# Patient Record
Sex: Male | Born: 1985 | Race: Black or African American | Hispanic: No | Marital: Single | Smoking: Current every day smoker
Health system: Southern US, Community
[De-identification: ages and names within clinical notes are randomized; demographics above are authoritative.]

---

## 2010-03-17 ENCOUNTER — Emergency Department: Payer: Self-pay | Admitting: Emergency Medicine

## 2010-11-16 ENCOUNTER — Emergency Department: Payer: Self-pay | Admitting: Emergency Medicine

## 2011-09-05 ENCOUNTER — Emergency Department: Payer: Self-pay | Admitting: Emergency Medicine

## 2011-12-07 ENCOUNTER — Emergency Department: Payer: Self-pay | Admitting: Emergency Medicine

## 2011-12-07 LAB — URINALYSIS, COMPLETE
Blood: NEGATIVE
Ketone: NEGATIVE
Nitrite: NEGATIVE
Ph: 6 (ref 4.5–8.0)
Protein: NEGATIVE
Specific Gravity: 1.018 (ref 1.003–1.030)

## 2012-02-21 ENCOUNTER — Emergency Department: Payer: Self-pay | Admitting: Emergency Medicine

## 2012-02-21 LAB — URINALYSIS, COMPLETE
Bilirubin,UR: NEGATIVE
Glucose,UR: NEGATIVE mg/dL (ref 0–75)
Ketone: NEGATIVE
Leukocyte Esterase: NEGATIVE
Nitrite: NEGATIVE
Specific Gravity: 1.005 (ref 1.003–1.030)
WBC UR: 2 /HPF (ref 0–5)

## 2012-12-30 ENCOUNTER — Emergency Department: Payer: Self-pay | Admitting: Internal Medicine

## 2012-12-30 LAB — URINALYSIS, COMPLETE
Bacteria: NONE SEEN
Bilirubin,UR: NEGATIVE
Blood: NEGATIVE
Ketone: NEGATIVE
Nitrite: NEGATIVE
Protein: NEGATIVE
RBC,UR: 3 /HPF (ref 0–5)
Squamous Epithelial: NONE SEEN

## 2013-01-03 ENCOUNTER — Emergency Department: Payer: Self-pay | Admitting: Emergency Medicine

## 2013-01-03 LAB — URINALYSIS, COMPLETE
Bilirubin,UR: NEGATIVE
Leukocyte Esterase: NEGATIVE
Nitrite: NEGATIVE
Protein: 30
Specific Gravity: 1.03 (ref 1.003–1.030)

## 2014-01-25 ENCOUNTER — Emergency Department: Payer: Self-pay | Admitting: Emergency Medicine

## 2014-01-26 LAB — CBC
HCT: 43.4 % (ref 40.0–52.0)
HGB: 13.8 g/dL (ref 13.0–18.0)
MCH: 30 pg (ref 26.0–34.0)
MCHC: 31.8 g/dL — ABNORMAL LOW (ref 32.0–36.0)
MCV: 94 fL (ref 80–100)
PLATELETS: 334 10*3/uL (ref 150–440)
RBC: 4.61 10*6/uL (ref 4.40–5.90)
RDW: 13.2 % (ref 11.5–14.5)
WBC: 10.1 10*3/uL (ref 3.8–10.6)

## 2014-01-26 LAB — COMPREHENSIVE METABOLIC PANEL
ALBUMIN: 3.7 g/dL (ref 3.4–5.0)
ALK PHOS: 88 U/L
ALT: 35 U/L
Anion Gap: 17 — ABNORMAL HIGH (ref 7–16)
BUN: 9 mg/dL (ref 7–18)
Bilirubin,Total: 0.3 mg/dL (ref 0.2–1.0)
CO2: 20 mmol/L — AB (ref 21–32)
Calcium, Total: 8.3 mg/dL — ABNORMAL LOW (ref 8.5–10.1)
Chloride: 105 mmol/L (ref 98–107)
Creatinine: 1.03 mg/dL (ref 0.60–1.30)
EGFR (African American): >60
GLUCOSE: 103 mg/dL — AB (ref 65–99)
OSMOLALITY: 282 (ref 275–301)
Potassium: 3.4 mmol/L — ABNORMAL LOW (ref 3.5–5.1)
SGOT(AST): 30 U/L (ref 15–37)
Sodium: 142 mmol/L (ref 136–145)
Total Protein: 7.5 g/dL (ref 6.4–8.2)

## 2014-01-26 LAB — ETHANOL: Ethanol: 43 mg/dL
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
## 2021-06-05 ENCOUNTER — Other Ambulatory Visit: Payer: Self-pay

## 2021-06-05 ENCOUNTER — Encounter (HOSPITAL_BASED_OUTPATIENT_CLINIC_OR_DEPARTMENT_OTHER): Payer: Self-pay | Admitting: Emergency Medicine

## 2021-06-05 DIAGNOSIS — N341 Nonspecific urethritis: Secondary | ICD-10-CM | POA: Insufficient documentation

## 2021-06-05 DIAGNOSIS — F17210 Nicotine dependence, cigarettes, uncomplicated: Secondary | ICD-10-CM | POA: Insufficient documentation

## 2021-06-05 LAB — URINALYSIS, ROUTINE W REFLEX MICROSCOPIC
Bilirubin Urine: NEGATIVE
Glucose, UA: NEGATIVE mg/dL
Hgb urine dipstick: NEGATIVE
Ketones, ur: 15 mg/dL — AB
Leukocytes,Ua: NEGATIVE
Nitrite: NEGATIVE
Protein, ur: 30 mg/dL — AB
Specific Gravity, Urine: >=1.03 (ref 1.005–1.030)
pH: 5.5 (ref 5.0–8.0)

## 2021-06-05 LAB — URINALYSIS, MICROSCOPIC (REFLEX)
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
## 2021-06-06 ENCOUNTER — Emergency Department (HOSPITAL_BASED_OUTPATIENT_CLINIC_OR_DEPARTMENT_OTHER)
Admission: EM | Admit: 2021-06-06 | Discharge: 2021-06-06 | Disposition: A | Discharge status: Home / Self Care | Payer: Self-pay | Attending: Emergency Medicine | Admitting: Emergency Medicine

## 2021-06-06 DIAGNOSIS — N342 Other urethritis: Secondary | ICD-10-CM

## 2021-06-06 MED ORDER — CEFTRIAXONE SODIUM 500 MG IJ SOLR
500.0000 mg | Freq: Once | INTRAMUSCULAR | Status: AC
  Administered 2021-06-06: 500 mg via INTRAMUSCULAR
  Filled 2021-06-06: qty 500

## 2021-06-06 MED ORDER — AZITHROMYCIN 1 G PO PACK
1.0000 g | PACK | Freq: Once | ORAL | Status: AC
Start: 2021-06-06 — End: 2021-06-06
  Administered 2021-06-06: 1 g via ORAL
  Filled 2021-06-06: qty 1

## 2021-06-06 NOTE — ED Provider Notes (Signed)
? ?[facility] ?Provider Note: Eric Dell, MD, FACEP ? ?CSN: [id number] ?MRN: [id number] ?ARRIVAL: 06/05/21 at 2212 ?ROOM: [facility] ? ? ?CHIEF COMPLAINT  ?Penile Discharge ? ? ?HISTORY OF PRESENT ILLNESS  ?06/06/21 1:19 AM ?Eric Vaughan is a 36 y.o. male who developed a painless, watery penile discharge about 2 weeks ago.  He was seen at the health department, diagnosed with nongonococcal urethritis, and was placed on 7 days of an unspecified antibiotic (orange capsules, likely doxycycline).  He continues to have the watery discharge despite completing the antibiotic course.  He still denies pain. ? ? ?History reviewed. No pertinent past medical history. ? ?History reviewed. No pertinent surgical history. ? ?No family history on file. ? ?Social History  ? ?Tobacco Use  ? Smoking status: Every Day  ?  Packs/day: 0.50  ?  Types: Cigarettes  ? Smokeless tobacco: Never  ?Substance Use Topics  ? Alcohol use: Yes  ?  Alcohol/week: 4.0 standard drinks  ?  Types: 4 Shots of liquor per week  ?  Comment: occasionally  ? Drug use: Yes  ?  Types: Marijuana  ?  Comment: ectasy  ? ? ?Prior to Admission medications   ?Not on File  ? ? ?Allergies ?Patient has no allergy information on record. ? ? ?REVIEW OF SYSTEMS  ?Negative except as noted here or in the History of Present Illness. ? ? ?PHYSICAL EXAMINATION  ?Initial Vital Signs ?Blood pressure (!) 142/86, pulse 70, temperature 98.4 ?F (36.9 ?C), temperature source Oral, resp. rate 18, height 5\' 11"  (1.803 m), weight 104.3 kg, SpO2 100 %. ? ?Examination ?General: Well-developed, well-nourished male in no acute distress; appearance consistent with age of record ?HENT: normocephalic; atraumatic ?Eyes: Normal appearance ?Neck: supple ?Heart: regular rate and rhythm ?Lungs: clear to auscultation bilaterally ?Abdomen: soft; nondistended; nontender; bowel sounds present ?GU: Tanner V male, circumcised; slight watery urethral discharge ?Extremities: No deformity; full  range of motion ?Neurologic: Awake, alert and oriented; motor function intact in all extremities and symmetric; no facial droop ?Skin: Warm and dry ?Psychiatric: Normal mood and affect ? ? ?RESULTS  ?Summary of this visit's results, reviewed and interpreted by myself: ? ? EKG Interpretation ? ?Date/Time:    ?Ventricular Rate:    ?PR Interval:    ?QRS Duration:   ?QT Interval:    ?QTC Calculation:   ?R Axis:     ?Text Interpretation:   ?  ? ?  ? ?Laboratory Studies: ?Results for orders placed or performed during the hospital encounter of 06/06/21 (from the past 24 hour(s))  ?Urinalysis, Routine w reflex microscopic Urine     Status: Abnormal  ? Collection Time: 06/05/21 10:23 PM  ?Result Value Ref Range  ? Color, Urine YELLOW YELLOW  ? APPearance CLEAR CLEAR  ? Specific Gravity, Urine >=1.030 1.005 - 1.030  ? pH 5.5 5.0 - 8.0  ? Glucose, UA NEGATIVE NEGATIVE mg/dL  ? Hgb urine dipstick NEGATIVE NEGATIVE  ? Bilirubin Urine NEGATIVE NEGATIVE  ? Ketones, ur 15 (A) NEGATIVE mg/dL  ? Protein, ur 30 (A) NEGATIVE mg/dL  ? Nitrite NEGATIVE NEGATIVE  ? Leukocytes,Ua NEGATIVE NEGATIVE  ?Urinalysis, Microscopic (reflex)     Status: Abnormal  ? Collection Time: 06/05/21 10:23 PM  ?Result Value Ref Range  ? RBC / HPF 0-5 0 - 5 RBC/hpf  ? WBC, UA 0-5 0 - 5 WBC/hpf  ? Bacteria, UA FEW (A) NONE SEEN  ? Squamous Epithelial / LPF 0-5 0 - 5  ? Mucus PRESENT   ? ?  Imaging Studies: ?No results found. ? ?ED COURSE and MDM  ?Nursing notes, initial and subsequent vitals signs, including pulse oximetry, reviewed and interpreted by myself. ? ?Vitals:  ? 06/05/21 2218 06/05/21 2220  ?BP:  (!) 142/86  ?Pulse:  70  ?Resp:  18  ?Temp:  98.4 ?F (36.9 ?C)  ?TempSrc:  Oral  ?SpO2:  100%  ?Weight: 104.3 kg   ?Height: 5\' 11"  (1.803 m)   ? ?Medications  ?cefTRIAXone (ROCEPHIN) injection 500 mg (has no administration in time range)  ?azithromycin (ZITHROMAX) powder 1 g (has no administration in time range)  ? ? ?It is possible the patient's infection was  resistant to doxycycline.  We will treat with a dose of Rocephin and Zithromax in the ED.  GC and Chlamydia testing pending. ? ?PROCEDURES  ?Procedures ? ? ?ED DIAGNOSES  ? ?  ICD-10-CM   ?1. Urethritis  N34.2   ?  ? ? ? ?  ? , MD ?06/06/21 0124 ? ?

## 2021-06-08 LAB — GC/CHLAMYDIA PROBE AMP (~~LOC~~) NOT AT ARMC
Chlamydia: NEGATIVE
Comment: NEGATIVE
Comment: NORMAL
Neisseria Gonorrhea: NEGATIVE

## 2022-06-27 ENCOUNTER — Other Ambulatory Visit: Payer: Self-pay

## 2022-06-27 ENCOUNTER — Emergency Department
Admission: EM | Admit: 2022-06-27 | Discharge: 2022-06-27 | Disposition: A | Discharge status: Home / Self Care | Payer: 59 | Attending: Emergency Medicine | Admitting: Emergency Medicine

## 2022-06-27 DIAGNOSIS — R310 Gross hematuria: Secondary | ICD-10-CM | POA: Diagnosis present

## 2022-06-27 DIAGNOSIS — N342 Other urethritis: Secondary | ICD-10-CM

## 2022-06-27 DIAGNOSIS — R300 Dysuria: Secondary | ICD-10-CM

## 2022-06-27 LAB — URINALYSIS, ROUTINE W REFLEX MICROSCOPIC
Bacteria, UA: NONE SEEN
Bilirubin Urine: NEGATIVE
Glucose, UA: NEGATIVE mg/dL
Ketones, ur: NEGATIVE mg/dL
Nitrite: NEGATIVE
Protein, ur: NEGATIVE mg/dL
Specific Gravity, Urine: 1.006 (ref 1.005–1.030)
Squamous Epithelial / HPF: NONE SEEN /HPF (ref 0–5)
pH: 6 (ref 5.0–8.0)

## 2022-06-27 LAB — CHLAMYDIA/NGC RT PCR (ARMC ONLY)
Chlamydia Tr: NOT DETECTED
N gonorrhoeae: NOT DETECTED

## 2022-06-27 MED ORDER — CEFTRIAXONE SODIUM 1 G IJ SOLR
500.0000 mg | Freq: Once | INTRAMUSCULAR | Status: AC
Start: 2022-06-27 — End: 2022-06-27
  Administered 2022-06-27: 500 mg via INTRAMUSCULAR
  Filled 2022-06-27: qty 10

## 2022-06-27 MED ORDER — METRONIDAZOLE 500 MG PO TABS: 2000.0000 mg | ORAL_TABLET | Freq: Once | ORAL | 0 refills | Status: AC

## 2022-06-27 MED ORDER — LIDOCAINE HCL (PF) 1 % IJ SOLN
5.0000 mL | Freq: Once | INTRAMUSCULAR | Status: AC
  Administered 2022-06-27: 5 mL
  Filled 2022-06-27: qty 5

## 2022-06-27 MED ORDER — DOXYCYCLINE HYCLATE 100 MG PO TABS
100.0000 mg | ORAL_TABLET | Freq: Once | ORAL | Status: AC
Start: 2022-06-27 — End: 2022-06-27
  Administered 2022-06-27: 100 mg via ORAL
  Filled 2022-06-27: qty 1

## 2022-06-27 MED ORDER — DOXYCYCLINE HYCLATE 100 MG PO TABS: 100.0000 mg | ORAL_TABLET | Freq: Two times a day (BID) | ORAL | 0 refills | Status: AC

## 2022-06-27 NOTE — ED Notes (Signed)
Dc instructions and scripts reviewed with pt no questions or concerns at this time. Will follow up with pcp or health dept
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
## 2022-06-27 NOTE — Discharge Instructions (Addendum)
You are being treated for a presumed infectious cause for your hematuria.  Take the antibiotics as directed.  Follow-up with primary provider or [facility] department for ongoing symptom management.  Avoid unprotected sexual contact while being treated.

## 2022-06-27 NOTE — ED Triage Notes (Signed)
Pt to ED for hematuria this am.
